# Patient Record
Sex: Male | Born: 2000 | Race: Black or African American | Hispanic: No | Marital: Single | Smoking: Never smoker
Health system: Southern US, Community
[De-identification: ages and names within clinical notes are randomized; demographics above are authoritative.]

## PROBLEM LIST (undated history)

## (undated) DIAGNOSIS — S62102A Fracture of unspecified carpal bone, left wrist, initial encounter for closed fracture: Secondary | ICD-10-CM

## (undated) DIAGNOSIS — A549 Gonococcal infection, unspecified: Secondary | ICD-10-CM

## (undated) DIAGNOSIS — F329 Major depressive disorder, single episode, unspecified: Secondary | ICD-10-CM

## (undated) DIAGNOSIS — F909 Attention-deficit hyperactivity disorder, unspecified type: Secondary | ICD-10-CM

## (undated) DIAGNOSIS — J309 Allergic rhinitis, unspecified: Secondary | ICD-10-CM

## (undated) DIAGNOSIS — J45909 Unspecified asthma, uncomplicated: Secondary | ICD-10-CM

## (undated) DIAGNOSIS — A749 Chlamydial infection, unspecified: Secondary | ICD-10-CM

## (undated) DIAGNOSIS — F32A Depression, unspecified: Secondary | ICD-10-CM

## (undated) DIAGNOSIS — Z8709 Personal history of other diseases of the respiratory system: Secondary | ICD-10-CM

## (undated) DIAGNOSIS — T7840XA Allergy, unspecified, initial encounter: Secondary | ICD-10-CM

## (undated) HISTORY — DX: Chlamydial infection, unspecified: A74.9

## (undated) HISTORY — DX: Fracture of unspecified carpal bone, left wrist, initial encounter for closed fracture: S62.102A

## (undated) HISTORY — DX: Personal history of other diseases of the respiratory system: Z87.09

## (undated) HISTORY — DX: Allergic rhinitis, unspecified: J30.9

## (undated) HISTORY — PX: TYMPANOSTOMY TUBE PLACEMENT: SHX32

---
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
## 2021-02-10 IMAGING — DX DG CHEST 1V PORT
1 series · 1 of 1 positions shown · non-contrast
Comparison: 11/17/2019

CLINICAL DATA: Follow-up pneumothorax.

EXAM:
PORTABLE CHEST 1 VIEW

[chest ap]
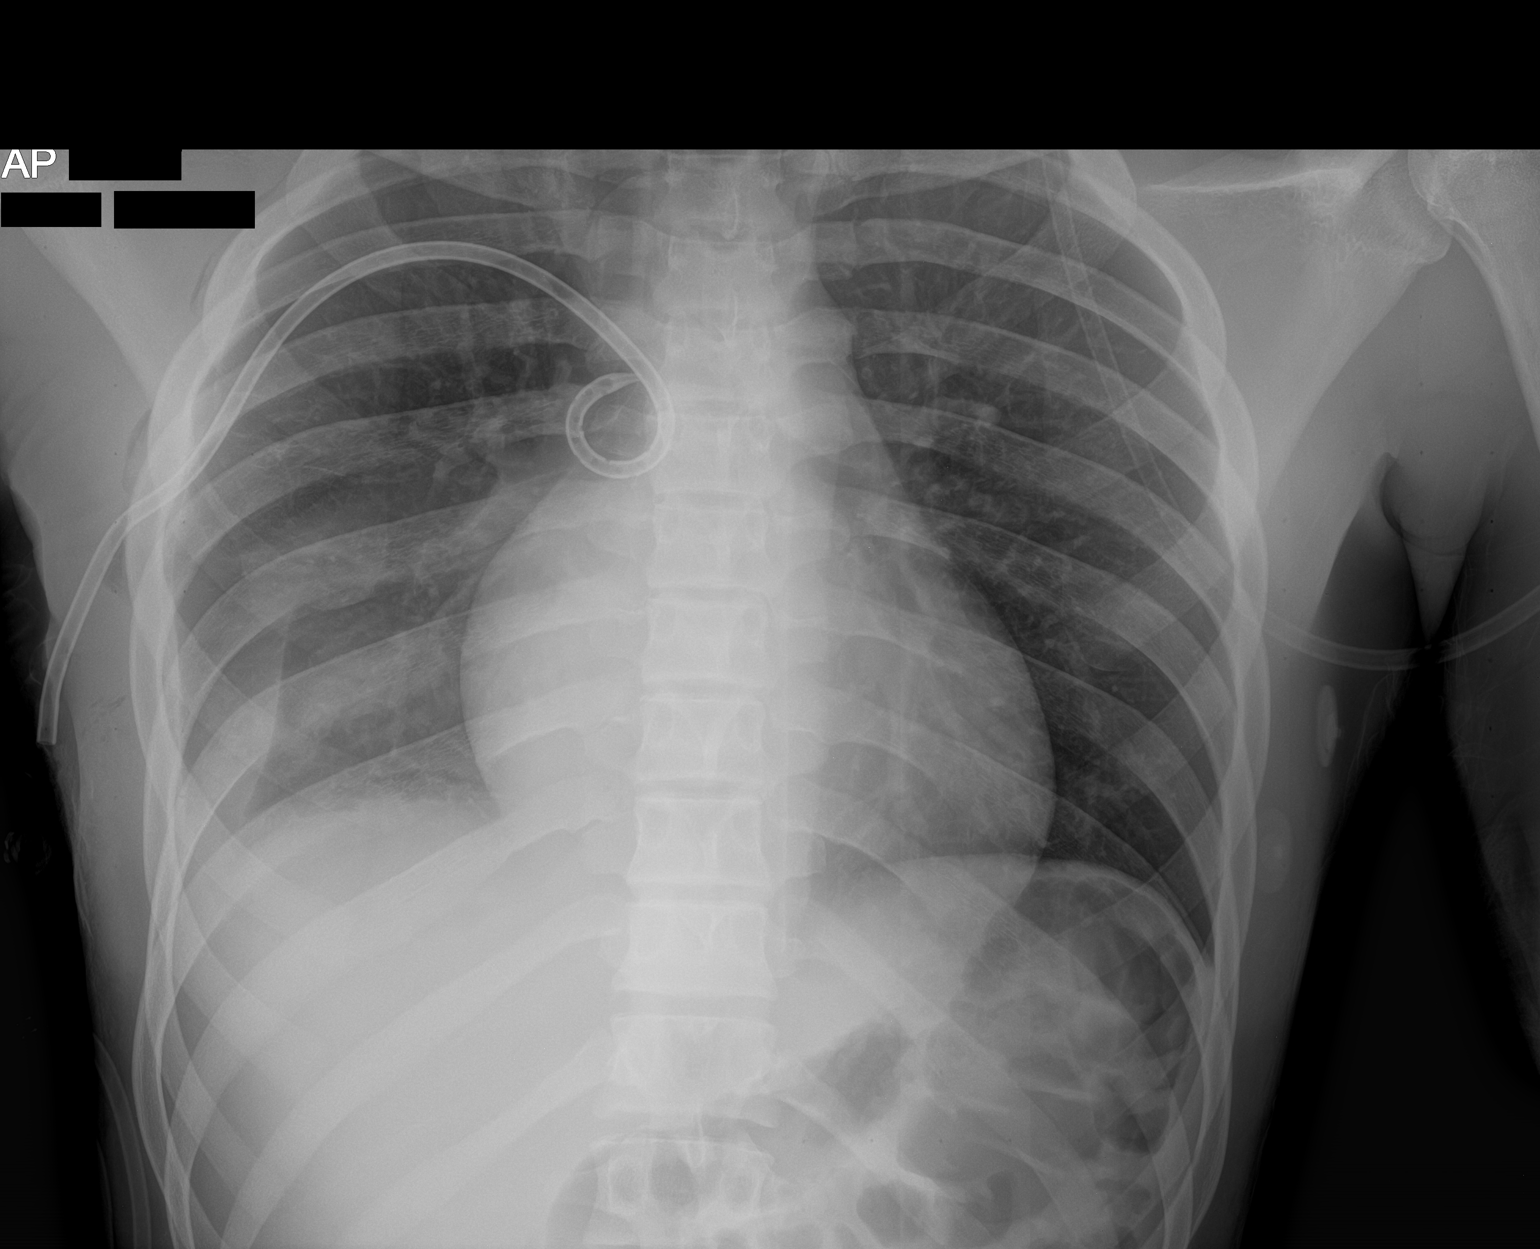

[1 of 1 positions shown; findings below may reference images not displayed]

FINDINGS: Right-sided chest tube in good position. Very small residual
pneumothorax. Small right pleural effusion and right basilar
atelectasis. The left lung is clear.

Small amount subcutaneous emphysema.
IMPRESSION: Right-sided chest tube in good position with a very small residual
pneumothorax.

## 2021-02-13 IMAGING — DX DG CHEST 1V PORT
1 series · 1 of 1 positions shown · non-contrast
Comparison: Yesterday

CLINICAL DATA: Chest tube

EXAM:
PORTABLE CHEST 1 VIEW

[chest ap]
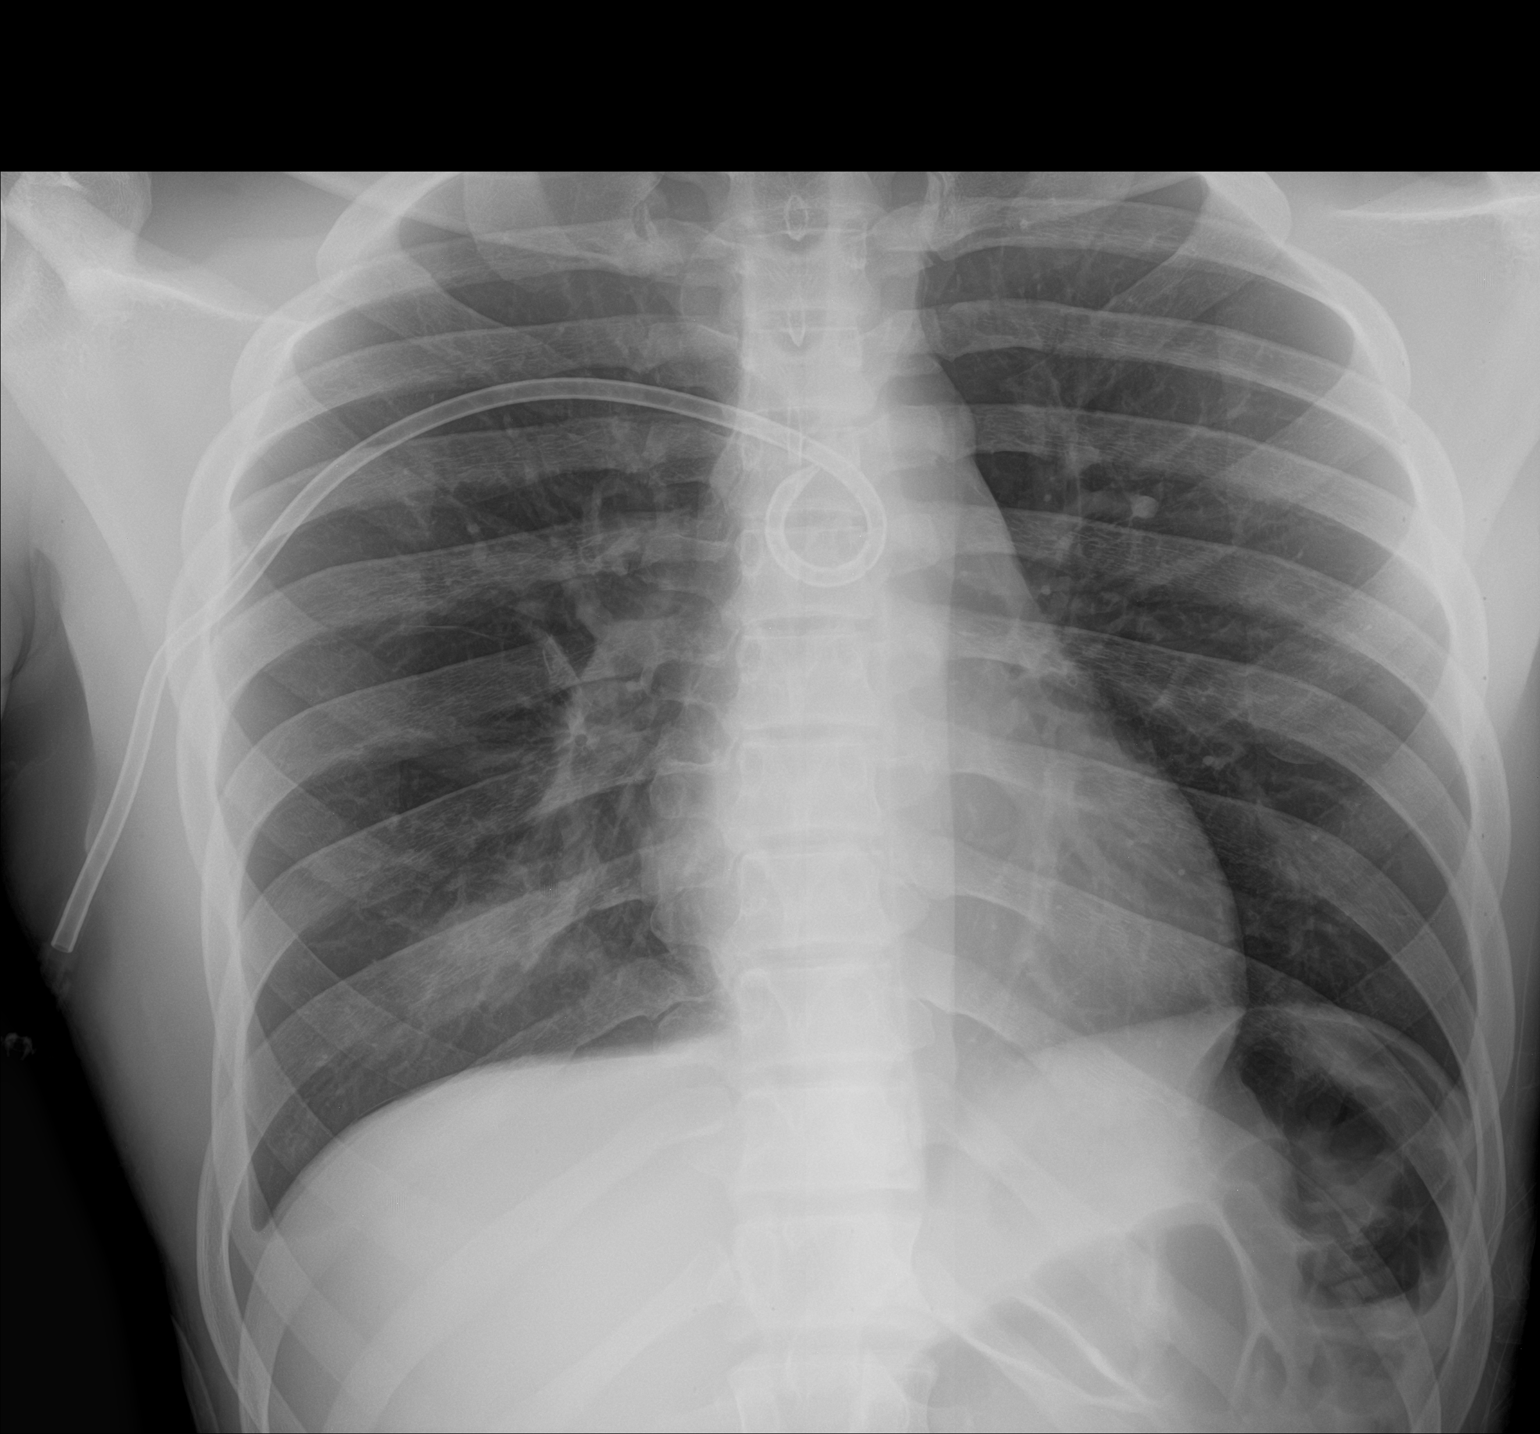

[1 of 1 positions shown; findings below may reference images not displayed]

FINDINGS: Unchanged chest tube with pigtail in the midline, presumably
anterior. Trace right apical pneumothorax. Mild hazy density at the
right base where there was pleural fluid and atelectasis on
admission chest CT.
IMPRESSION: Stable trace right apical pneumothorax.

## 2021-02-13 IMAGING — DX DG CHEST 1V PORT
1 series · 1 of 1 positions shown · non-contrast
Comparison: November 21, 2019

CLINICAL DATA: Achalasia.  Chest tube removal

EXAM:
PORTABLE CHEST 1 VIEW

[chest]
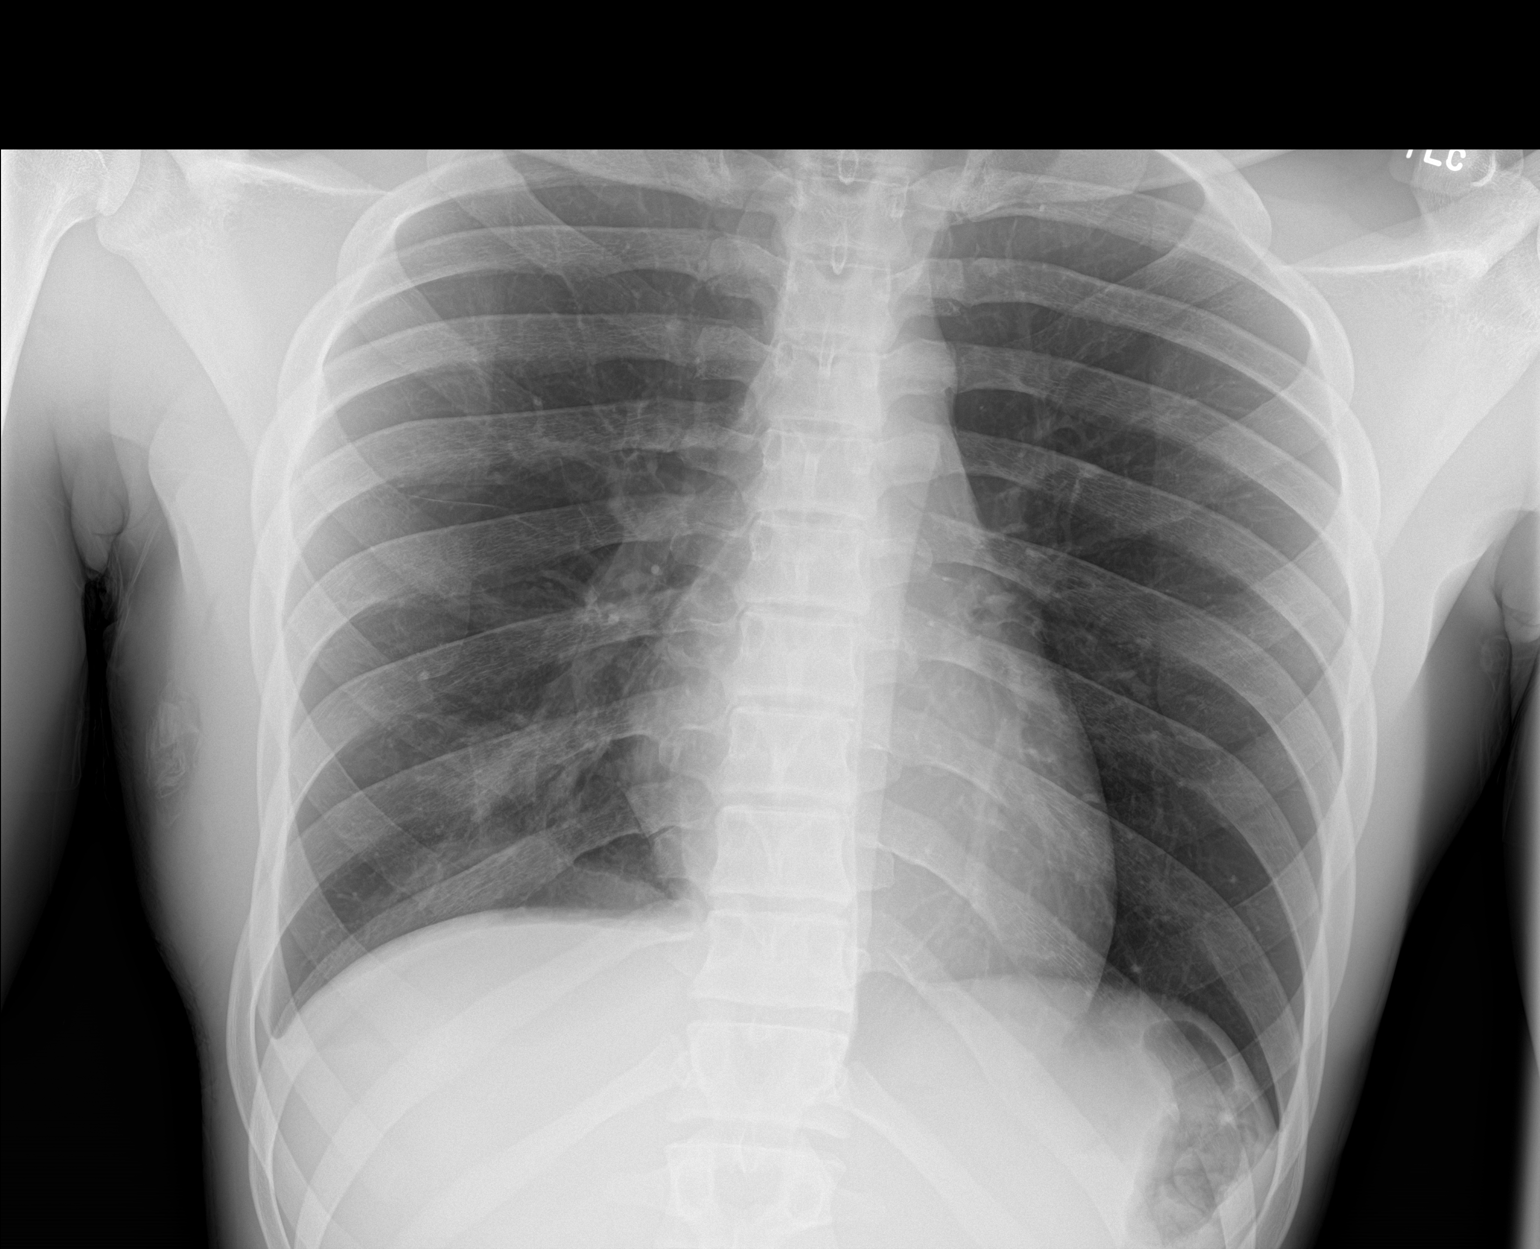

[1 of 1 positions shown; findings below may reference images not displayed]

FINDINGS: Chest tube on the right is been removed. Trace right apical
pneumothorax persists without change. Slight atelectasis remains in
the right base. No new opacity evident. Heart size and pulmonary
vascularity are normal. No esophageal dilatation evident. No
adenopathy. No bone lesions.
IMPRESSION: Trace pneumothorax on the right persists following chest tube
removal. No progression of pneumothorax. Atelectatic change right
base, similar to earlier in the day. No new opacity. Stable cardiac
silhouette.

## 2021-06-21 DIAGNOSIS — S63502A Unspecified sprain of left wrist, initial encounter: Secondary | ICD-10-CM | POA: Diagnosis not present

## 2021-06-21 DIAGNOSIS — M25532 Pain in left wrist: Secondary | ICD-10-CM | POA: Diagnosis not present

## 2021-08-05 ENCOUNTER — Encounter: Payer: Self-pay | Admitting: Emergency Medicine

## 2021-08-05 ENCOUNTER — Ambulatory Visit (INDEPENDENT_AMBULATORY_CARE_PROVIDER_SITE_OTHER): Payer: Medicaid Other

## 2021-08-05 ENCOUNTER — Ambulatory Visit
Admission: EM | Admit: 2021-08-05 | Discharge: 2021-08-05 | Disposition: A | Discharge status: Home / Self Care | Payer: Medicaid Other | Attending: Urgent Care | Admitting: Urgent Care

## 2021-08-05 DIAGNOSIS — M79641 Pain in right hand: Secondary | ICD-10-CM

## 2021-08-05 DIAGNOSIS — M7989 Other specified soft tissue disorders: Secondary | ICD-10-CM | POA: Diagnosis not present

## 2021-08-05 DIAGNOSIS — S60229A Contusion of unspecified hand, initial encounter: Secondary | ICD-10-CM

## 2021-08-05 MED ORDER — NAPROXEN 500 MG PO TABS: 500.0000 mg | ORAL_TABLET | Freq: Two times a day (BID) | ORAL | 0 refills | Status: DC

## 2021-08-05 NOTE — ED Triage Notes (Signed)
Pt was very difficult to arouse from sleep in the waiting room. Pt is at times incoherent during triage, but states he just went to be late, but was up this morning playing basketball around 8;30 this morning when he states he fell forward and caught himself with his right hand. Pt presents with right hand swelling and pain.
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
## 2022-02-08 ENCOUNTER — Ambulatory Visit
Admission: EM | Admit: 2022-02-08 | Discharge: 2022-02-08 | Disposition: A | Discharge status: Home / Self Care | Payer: Medicaid Other | Attending: Urgent Care | Admitting: Urgent Care

## 2022-02-08 ENCOUNTER — Ambulatory Visit (INDEPENDENT_AMBULATORY_CARE_PROVIDER_SITE_OTHER): Payer: Medicaid Other

## 2022-02-08 DIAGNOSIS — M25532 Pain in left wrist: Secondary | ICD-10-CM | POA: Diagnosis not present

## 2022-02-08 DIAGNOSIS — R52 Pain, unspecified: Secondary | ICD-10-CM

## 2022-02-08 DIAGNOSIS — S62102A Fracture of unspecified carpal bone, left wrist, initial encounter for closed fracture: Secondary | ICD-10-CM | POA: Diagnosis not present

## 2022-02-08 DIAGNOSIS — M545 Low back pain, unspecified: Secondary | ICD-10-CM | POA: Insufficient documentation

## 2022-02-08 DIAGNOSIS — M542 Cervicalgia: Secondary | ICD-10-CM | POA: Diagnosis not present

## 2022-02-08 DIAGNOSIS — Z113 Encounter for screening for infections with a predominantly sexual mode of transmission: Secondary | ICD-10-CM

## 2022-02-08 MED ORDER — TIZANIDINE HCL 4 MG PO TABS: 4.0000 mg | ORAL_TABLET | Freq: Every day | ORAL | 0 refills | Status: DC

## 2022-02-08 MED ORDER — NAPROXEN 500 MG PO TABS: 500.0000 mg | ORAL_TABLET | Freq: Two times a day (BID) | ORAL | 0 refills | Status: DC

## 2022-02-08 NOTE — Discharge Instructions (Signed)
The x-ray suggest that you have a scaphoid fracture.  It is not definitive.  I recommend you wear the thumb spica splint at all times.  Once you to follow-up with [facility].  The specialist that you see there may end up getting more x-rays or getting a CT scan which is much more specific.  In the meantime continue using naproxen for pain and inflammation.
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
## 2022-02-09 LAB — CYTOLOGY, (ORAL, ANAL, URETHRAL) ANCILLARY ONLY
Chlamydia: NEGATIVE
Comment: NEGATIVE
Comment: NEGATIVE
Comment: NORMAL
Neisseria Gonorrhea: NEGATIVE
Trichomonas: NEGATIVE

## 2022-03-03 DIAGNOSIS — M545 Low back pain, unspecified: Secondary | ICD-10-CM | POA: Diagnosis not present

## 2022-03-11 ENCOUNTER — Encounter: Payer: Self-pay | Admitting: Family Medicine

## 2022-03-11 ENCOUNTER — Ambulatory Visit (INDEPENDENT_AMBULATORY_CARE_PROVIDER_SITE_OTHER): Payer: Self-pay | Admitting: Family Medicine

## 2022-03-11 VITALS — BP 113/71 | HR 53 | Temp 98.5°F | Ht 73.0 in | Wt 167.6 lb

## 2022-03-11 DIAGNOSIS — M545 Low back pain, unspecified: Secondary | ICD-10-CM

## 2022-03-11 DIAGNOSIS — S62002G Unspecified fracture of navicular [scaphoid] bone of left wrist, subsequent encounter for fracture with delayed healing: Secondary | ICD-10-CM

## 2022-03-11 DIAGNOSIS — M25532 Pain in left wrist: Secondary | ICD-10-CM

## 2022-03-11 MED ORDER — MELOXICAM 15 MG PO TABS: 15.0000 mg | ORAL_TABLET | Freq: Every day | ORAL | 0 refills | Status: AC
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
## 2022-03-15 ENCOUNTER — Ambulatory Visit (HOSPITAL_BASED_OUTPATIENT_CLINIC_OR_DEPARTMENT_OTHER)
Admission: RE | Admit: 2022-03-15 | Discharge: 2022-03-15 | Disposition: A | Discharge status: Home / Self Care | Payer: Medicaid Other | Source: Ambulatory Visit | Attending: Family Medicine | Admitting: Family Medicine

## 2022-03-15 DIAGNOSIS — S62002G Unspecified fracture of navicular [scaphoid] bone of left wrist, subsequent encounter for fracture with delayed healing: Secondary | ICD-10-CM | POA: Diagnosis present

## 2022-03-15 DIAGNOSIS — M545 Low back pain, unspecified: Secondary | ICD-10-CM | POA: Diagnosis not present

## 2022-03-15 DIAGNOSIS — M4316 Spondylolisthesis, lumbar region: Secondary | ICD-10-CM | POA: Diagnosis not present

## 2022-03-15 DIAGNOSIS — S62112A Displaced fracture of triquetrum [cuneiform] bone, left wrist, initial encounter for closed fracture: Secondary | ICD-10-CM | POA: Diagnosis not present

## 2022-03-15 DIAGNOSIS — M25532 Pain in left wrist: Secondary | ICD-10-CM

## 2022-03-16 DIAGNOSIS — S62002A Unspecified fracture of navicular [scaphoid] bone of left wrist, initial encounter for closed fracture: Secondary | ICD-10-CM | POA: Diagnosis not present

## 2022-03-16 DIAGNOSIS — M545 Low back pain, unspecified: Secondary | ICD-10-CM | POA: Diagnosis not present

## 2022-03-17 ENCOUNTER — Other Ambulatory Visit: Payer: Self-pay | Admitting: Orthopedic Surgery

## 2022-03-17 DIAGNOSIS — M545 Low back pain, unspecified: Secondary | ICD-10-CM | POA: Diagnosis not present

## 2022-03-17 DIAGNOSIS — S62002A Unspecified fracture of navicular [scaphoid] bone of left wrist, initial encounter for closed fracture: Secondary | ICD-10-CM

## 2022-03-22 DIAGNOSIS — M545 Low back pain, unspecified: Secondary | ICD-10-CM | POA: Diagnosis not present

## 2022-03-24 ENCOUNTER — Ambulatory Visit: Payer: Medicaid Other | Attending: Orthopedic Surgery

## 2022-03-30 DIAGNOSIS — M545 Low back pain, unspecified: Secondary | ICD-10-CM | POA: Diagnosis not present

## 2022-04-01 ENCOUNTER — Emergency Department (HOSPITAL_COMMUNITY)
Admission: EM | Admit: 2022-04-01 | Discharge: 2022-04-02 | Disposition: A | Discharge status: Home / Self Care | Payer: No Typology Code available for payment source | Attending: Emergency Medicine | Admitting: Emergency Medicine

## 2022-04-01 ENCOUNTER — Emergency Department (HOSPITAL_COMMUNITY): Payer: No Typology Code available for payment source

## 2022-04-01 ENCOUNTER — Other Ambulatory Visit: Payer: Self-pay

## 2022-04-01 ENCOUNTER — Ambulatory Visit: Payer: Medicaid Other | Admitting: Family Medicine

## 2022-04-01 ENCOUNTER — Encounter (HOSPITAL_COMMUNITY): Payer: Self-pay | Admitting: Emergency Medicine

## 2022-04-01 DIAGNOSIS — G8911 Acute pain due to trauma: Secondary | ICD-10-CM | POA: Diagnosis not present

## 2022-04-01 DIAGNOSIS — J45909 Unspecified asthma, uncomplicated: Secondary | ICD-10-CM | POA: Diagnosis not present

## 2022-04-01 DIAGNOSIS — Y92410 Unspecified street and highway as the place of occurrence of the external cause: Secondary | ICD-10-CM | POA: Insufficient documentation

## 2022-04-01 DIAGNOSIS — M25511 Pain in right shoulder: Secondary | ICD-10-CM

## 2022-04-01 DIAGNOSIS — M25572 Pain in left ankle and joints of left foot: Secondary | ICD-10-CM

## 2022-04-01 DIAGNOSIS — Z7951 Long term (current) use of inhaled steroids: Secondary | ICD-10-CM | POA: Diagnosis not present

## 2022-04-01 DIAGNOSIS — M545 Low back pain, unspecified: Secondary | ICD-10-CM | POA: Diagnosis not present

## 2022-04-01 MED ORDER — KETOROLAC TROMETHAMINE 60 MG/2ML IM SOLN
30.0000 mg | Freq: Once | INTRAMUSCULAR | Status: AC
  Administered 2022-04-02: 30 mg via INTRAMUSCULAR
  Filled 2022-04-01: qty 2
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
## 2022-04-02 DIAGNOSIS — M25572 Pain in left ankle and joints of left foot: Secondary | ICD-10-CM | POA: Diagnosis not present

## 2022-04-02 DIAGNOSIS — M25511 Pain in right shoulder: Secondary | ICD-10-CM | POA: Diagnosis not present

## 2022-04-02 NOTE — ED Notes (Signed)
Sling applied to patient. Patient given discharge instructions and follow up care. Patient verbalized understanding. Patient ambulatory out of ED.

## 2022-04-06 ENCOUNTER — Ambulatory Visit
Admission: RE | Admit: 2022-04-06 | Discharge: 2022-04-06 | Disposition: A | Discharge status: Home / Self Care | Payer: Medicaid Other | Source: Ambulatory Visit | Attending: Orthopedic Surgery | Admitting: Orthopedic Surgery

## 2022-04-06 ENCOUNTER — Encounter: Payer: Self-pay | Admitting: Family Medicine

## 2022-04-06 ENCOUNTER — Ambulatory Visit (INDEPENDENT_AMBULATORY_CARE_PROVIDER_SITE_OTHER): Payer: Medicaid Other | Admitting: Family Medicine

## 2022-04-06 ENCOUNTER — Telehealth: Payer: Self-pay

## 2022-04-06 ENCOUNTER — Ambulatory Visit: Payer: Medicaid Other

## 2022-04-06 VITALS — BP 121/84 | HR 72 | Temp 98.7°F | Ht 74.0 in | Wt 162.8 lb

## 2022-04-06 DIAGNOSIS — S43402D Unspecified sprain of left shoulder joint, subsequent encounter: Secondary | ICD-10-CM

## 2022-04-06 DIAGNOSIS — M25511 Pain in right shoulder: Secondary | ICD-10-CM

## 2022-04-06 DIAGNOSIS — M545 Low back pain, unspecified: Secondary | ICD-10-CM

## 2022-04-06 DIAGNOSIS — M5136 Other intervertebral disc degeneration, lumbar region: Secondary | ICD-10-CM

## 2022-04-06 DIAGNOSIS — S62002A Unspecified fracture of navicular [scaphoid] bone of left wrist, initial encounter for closed fracture: Secondary | ICD-10-CM | POA: Insufficient documentation

## 2022-04-06 DIAGNOSIS — M51369 Other intervertebral disc degeneration, lumbar region without mention of lumbar back pain or lower extremity pain: Secondary | ICD-10-CM

## 2022-04-06 DIAGNOSIS — S43401A Unspecified sprain of right shoulder joint, initial encounter: Secondary | ICD-10-CM

## 2022-04-06 NOTE — Telephone Encounter (Signed)
Patient forgot to ask for PT referral at his appt today with Dr. Anitra Lauth.  Place he would like to be referred to: [facility] in [place] office  [place] can be reached at [phone]

## 2022-04-06 NOTE — Telephone Encounter (Signed)
Garrett Zhang, PT referral ordered

## 2022-04-06 NOTE — Telephone Encounter (Signed)
Their office number is [phone]. LVM for pt to return call.  Please Advise if pt needs new referral for PT, referral placed on 03/11/22.

## 2022-04-06 NOTE — Telephone Encounter (Signed)
If pt returns call, please inform of message below.
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
## 2022-04-07 NOTE — Telephone Encounter (Signed)
MyChart message has been sent to patient informing PT referral was ordered.

## 2022-04-08 ENCOUNTER — Ambulatory Visit: Payer: Self-pay | Admitting: Family Medicine

## 2022-04-11 ENCOUNTER — Ambulatory Visit: Payer: Medicaid Other | Admitting: Family Medicine

## 2022-04-13 DIAGNOSIS — M545 Low back pain, unspecified: Secondary | ICD-10-CM | POA: Diagnosis not present

## 2022-04-14 DIAGNOSIS — S62002A Unspecified fracture of navicular [scaphoid] bone of left wrist, initial encounter for closed fracture: Secondary | ICD-10-CM | POA: Diagnosis not present

## 2022-04-18 ENCOUNTER — Encounter: Payer: Self-pay | Admitting: Family Medicine

## 2022-04-19 DIAGNOSIS — M545 Low back pain, unspecified: Secondary | ICD-10-CM | POA: Diagnosis not present

## 2022-04-19 DIAGNOSIS — S62002A Unspecified fracture of navicular [scaphoid] bone of left wrist, initial encounter for closed fracture: Secondary | ICD-10-CM | POA: Diagnosis not present

## 2022-04-22 ENCOUNTER — Other Ambulatory Visit: Payer: Self-pay | Admitting: Family Medicine

## 2022-04-22 DIAGNOSIS — R079 Chest pain, unspecified: Secondary | ICD-10-CM | POA: Diagnosis not present

## 2022-04-22 DIAGNOSIS — R9431 Abnormal electrocardiogram [ECG] [EKG]: Secondary | ICD-10-CM | POA: Diagnosis not present

## 2022-04-22 DIAGNOSIS — J4530 Mild persistent asthma, uncomplicated: Secondary | ICD-10-CM

## 2022-04-22 NOTE — Telephone Encounter (Signed)
Pt's mother Garrett Zhang called and reports that her son needs a refill on his Albuterol and also his rescue inhaler which she did not know the name of. She mentioned "Qvar" (Sp). Garrett Zhang is also requesting pain medication due to his hand still hurting pretty bad. He was scheduled to get surgery today, but had to reschedule due to EKG results.

## 2022-04-22 NOTE — Telephone Encounter (Signed)
Pharmacy updated for refills.

## 2022-04-22 NOTE — Telephone Encounter (Signed)
Pt was last seen 3/27, 2 inhalers pending. Please also advise on pain med request.

## 2022-04-22 NOTE — Telephone Encounter (Signed)
I forgot to mention the Pharmacy to send this to is [facility] on [address] in [place] and his mother can be reached at [phone]

## 2022-04-25 MED ORDER — FLUTICASONE PROPIONATE HFA 110 MCG/ACT IN AERO: 1.0000 | INHALATION_SPRAY | Freq: Two times a day (BID) | RESPIRATORY_TRACT | 2 refills | Status: DC

## 2022-04-25 MED ORDER — ALBUTEROL SULFATE HFA 108 (90 BASE) MCG/ACT IN AERS: 2.0000 | INHALATION_SPRAY | RESPIRATORY_TRACT | 0 refills | Status: DC | PRN

## 2022-04-25 NOTE — Telephone Encounter (Signed)
Okay, refills sent 

## 2022-05-05 DIAGNOSIS — S62002A Unspecified fracture of navicular [scaphoid] bone of left wrist, initial encounter for closed fracture: Secondary | ICD-10-CM | POA: Diagnosis not present

## 2022-05-19 DIAGNOSIS — Z01818 Encounter for other preprocedural examination: Secondary | ICD-10-CM | POA: Diagnosis not present

## 2022-05-26 ENCOUNTER — Ambulatory Visit
Admission: EM | Admit: 2022-05-26 | Discharge: 2022-05-26 | Disposition: A | Discharge status: Home / Self Care | Payer: Medicaid Other | Attending: Urgent Care | Admitting: Urgent Care

## 2022-05-26 DIAGNOSIS — Z202 Contact with and (suspected) exposure to infections with a predominantly sexual mode of transmission: Secondary | ICD-10-CM | POA: Insufficient documentation

## 2022-05-26 MED ORDER — DOXYCYCLINE HYCLATE 100 MG PO CAPS: 100.0000 mg | ORAL_CAPSULE | Freq: Two times a day (BID) | ORAL | 0 refills | Status: AC
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
## 2022-05-26 NOTE — Discharge Instructions (Addendum)
We are treating you today for a presumed chlamydia infection given your recent positive exposure.  We are also testing you for both chlamydia and gonorrhea.  If your test comes back positive for an infection other than chlamydia, someone will contact you to arrange treatment.  Follow up here or with your primary care provider if your symptoms are worsening or not improving with treatment.

## 2022-05-26 NOTE — ED Triage Notes (Signed)
Patient presents to [facility] for STD testing. Exposed to chlamydia. No symptoms.

## 2022-05-27 LAB — CYTOLOGY, (ORAL, ANAL, URETHRAL) ANCILLARY ONLY
Chlamydia: NEGATIVE
Comment: NEGATIVE
Comment: NEGATIVE
Comment: NORMAL
Neisseria Gonorrhea: POSITIVE — AB
Trichomonas: NEGATIVE

## 2022-05-30 ENCOUNTER — Emergency Department (HOSPITAL_COMMUNITY)
Admission: EM | Admit: 2022-05-30 | Discharge: 2022-05-30 | Disposition: A | Discharge status: Home / Self Care | Payer: Medicaid Other | Attending: Emergency Medicine | Admitting: Emergency Medicine

## 2022-05-30 ENCOUNTER — Other Ambulatory Visit: Payer: Self-pay

## 2022-05-30 ENCOUNTER — Encounter (HOSPITAL_COMMUNITY): Payer: Self-pay

## 2022-05-30 DIAGNOSIS — A549 Gonococcal infection, unspecified: Secondary | ICD-10-CM | POA: Diagnosis not present

## 2022-05-30 HISTORY — DX: Gonococcal infection, unspecified: A54.9

## 2022-05-30 MED ORDER — CEFTRIAXONE SODIUM 1 G IJ SOLR
500.0000 mg | Freq: Once | INTRAMUSCULAR | Status: AC
  Administered 2022-05-30: 500 mg via INTRAMUSCULAR
  Filled 2022-05-30: qty 10

## 2022-05-30 MED ORDER — LIDOCAINE HCL (PF) 1 % IJ SOLN
INTRAMUSCULAR | Status: AC
  Filled 2022-05-30: qty 30

## 2022-05-30 NOTE — ED Provider Notes (Signed)
[facility] EMERGENCY DEPARTMENT AT [facility] Provider Note   CSN: [id number] Arrival date & time: 05/30/22  0230     History  Chief Complaint  Patient presents with   SEXUALLY TRANSMITTED DISEASE    Garrett Zhang is a 22 y.o. male.  22 year old male presents for treatment of gonorrhea, seen at UC 4 days ago after chlamydia exposure and treated with Doxycycline which he is taking. No symptoms. Tested positive for gonorrhea, negative for chlamydia.        Home Medications Prior to Admission medications   Medication Sig Start Date End Date Taking? Authorizing Provider  albuterol (VENTOLIN HFA) 108 (90 Base) MCG/ACT inhaler Inhale 2 puffs into the lungs every 4 (four) hours as needed for wheezing. Patient may resume home supply. 04/25/22   McGowen, Maryjean Morn, MD  doxycycline (VIBRAMYCIN) 100 MG capsule Take 1 capsule (100 mg total) by mouth 2 (two) times daily for 7 days. Avoid direct exposure to sun. 05/26/22 06/02/22  Immordino, Jeannett Senior, FNP  FLUoxetine (PROZAC) 20 MG capsule Take by mouth. 03/01/19   [provider]  fluticasone (FLOVENT HFA) 110 MCG/ACT inhaler Inhale 1 puff into the lungs 2 (two) times daily. 04/25/22   McGowen, Maryjean Morn, MD  meloxicam (MOBIC) 15 MG tablet Take 1 tablet (15 mg total) by mouth daily. 03/11/22   McGowen, Maryjean Morn, MD      Allergies    Bee pollen, Chocolate flavor, Pollen extract, Cocoa, Tramadol hcl, Chocolate, Hydrocodone, Other, Tramadol, and Other    Review of Systems   Review of Systems Negative except as per HPI Physical Exam Updated Vital Signs BP (!) 154/114 (BP Location: Right Arm)   Pulse 63   Temp 98.1 F (36.7 C) (Oral)   Resp 18   Ht 6\' 2"  (1.88 m)   Wt 73.5 kg   SpO2 95%   BMI 20.80 kg/m  Physical Exam Vitals and nursing note reviewed.  Constitutional:      General: He is not in acute distress.    Appearance: He is well-developed. He is not diaphoretic.  HENT:     Head: Normocephalic and  atraumatic.  Cardiovascular:     Rate and Rhythm: Normal rate and regular rhythm.     Heart sounds: Normal heart sounds.  Pulmonary:     Effort: Pulmonary effort is normal.     Breath sounds: Normal breath sounds.  Skin:    General: Skin is warm and dry.  Neurological:     Mental Status: He is alert and oriented to person, place, and time.  Psychiatric:        Behavior: Behavior normal.     ED Results / Procedures / Treatments   Labs (all labs ordered are listed, but only abnormal results are displayed) Labs Reviewed - No data to display  EKG None  Radiology No results found.  Procedures Procedures    Medications Ordered in ED Medications  cefTRIAXone (ROCEPHIN) injection 500 mg (has no administration in time range)  lidocaine (PF) (XYLOCAINE) 1 % injection (has no administration in time range)    ED Course/ Medical Decision Making/ A&P                             Medical Decision Making Risk Prescription drug management.   22 year old male presents for treatment of his gonorrhea, he is asymptomatic.  Currently taking doxycycline as prescribed at urgent care 4 days ago where he  was tested and treated for presumed chlamydia exposure.  He is tested positive for gonorrhea, will treat with Rocephin and recommend follow-up with health department.        Final Clinical Impression(s) / ED Diagnoses Final diagnoses:  Gonorrhea    Rx / DC Orders ED Discharge Orders     None         Alden Hipp 05/30/22 Tommy Rainwater, MD 05/30/22 [phone]

## 2022-05-30 NOTE — ED Triage Notes (Signed)
Pt. Arrives POV stating that his STD test came back positive for gonorrhea. Pt. States that he has been on antibiotics for 4 days, but his penis is still leaking.

## 2022-05-30 NOTE — Discharge Instructions (Signed)
Complete your doxycycline as prescribed at urgent care. Do NOT have sex until all parties have completed their antibiotics. Follow up at the health department for FREE STD TESTING AND TREATMENT.

## 2022-05-31 DIAGNOSIS — S62002D Unspecified fracture of navicular [scaphoid] bone of left wrist, subsequent encounter for fracture with routine healing: Secondary | ICD-10-CM | POA: Diagnosis not present

## 2022-06-07 DIAGNOSIS — S62002A Unspecified fracture of navicular [scaphoid] bone of left wrist, initial encounter for closed fracture: Secondary | ICD-10-CM | POA: Diagnosis not present

## 2022-06-15 DIAGNOSIS — R369 Urethral discharge, unspecified: Secondary | ICD-10-CM | POA: Diagnosis not present

## 2022-06-15 DIAGNOSIS — Z113 Encounter for screening for infections with a predominantly sexual mode of transmission: Secondary | ICD-10-CM | POA: Diagnosis not present

## 2022-07-01 ENCOUNTER — Ambulatory Visit
Admission: EM | Admit: 2022-07-01 | Discharge: 2022-07-01 | Disposition: A | Discharge status: Home / Self Care | Payer: Medicaid Other | Attending: Urgent Care | Admitting: Urgent Care

## 2022-07-01 DIAGNOSIS — J4530 Mild persistent asthma, uncomplicated: Secondary | ICD-10-CM | POA: Insufficient documentation

## 2022-07-01 DIAGNOSIS — Z1152 Encounter for screening for COVID-19: Secondary | ICD-10-CM | POA: Insufficient documentation

## 2022-07-01 DIAGNOSIS — J988 Other specified respiratory disorders: Secondary | ICD-10-CM | POA: Insufficient documentation

## 2022-07-01 DIAGNOSIS — R070 Pain in throat: Secondary | ICD-10-CM | POA: Insufficient documentation

## 2022-07-01 DIAGNOSIS — B9789 Other viral agents as the cause of diseases classified elsewhere: Secondary | ICD-10-CM | POA: Diagnosis not present

## 2022-07-01 LAB — POCT RAPID STREP A (OFFICE): Rapid Strep A Screen: NEGATIVE

## 2022-07-01 MED ORDER — PROMETHAZINE-DM 6.25-15 MG/5ML PO SYRP: 5.0000 mL | ORAL_SOLUTION | Freq: Three times a day (TID) | ORAL | 0 refills | Status: AC | PRN

## 2022-07-01 MED ORDER — IBUPROFEN 800 MG PO TABS
800.0000 mg | ORAL_TABLET | Freq: Once | ORAL | Status: AC
  Administered 2022-07-01: 800 mg via ORAL

## 2022-07-01 MED ORDER — CETIRIZINE HCL 10 MG PO TABS: 10.0000 mg | ORAL_TABLET | Freq: Every day | ORAL | 0 refills | Status: DC

## 2022-07-01 MED ORDER — PSEUDOEPHEDRINE HCL 30 MG PO TABS: 30.0000 mg | ORAL_TABLET | Freq: Three times a day (TID) | ORAL | 0 refills | Status: AC | PRN

## 2022-07-01 MED ORDER — ALBUTEROL SULFATE HFA 108 (90 BASE) MCG/ACT IN AERS: 2.0000 | INHALATION_SPRAY | RESPIRATORY_TRACT | 0 refills | Status: DC | PRN

## 2022-07-01 MED ORDER — IBUPROFEN 600 MG PO TABS: 600.0000 mg | ORAL_TABLET | Freq: Four times a day (QID) | ORAL | 0 refills | Status: AC | PRN

## 2022-07-01 NOTE — Discharge Instructions (Signed)
We will notify you of your test results as they arrive and may take between about 24 hours.  I encourage you to sign up for MyChart if you have not already done so as this can be the easiest way for us to communicate results to you online or through a phone app.  Generally, we only contact you if it is a positive test result.  In the meantime, if you develop worsening symptoms including fever, chest pain, shortness of breath despite our current treatment plan then please report to the emergency room as this may be a sign of worsening status from possible viral infection.  Otherwise, we will manage this as a viral syndrome. For sore throat or cough try using a honey-based tea. Use 3 teaspoons of honey with juice squeezed from half lemon. Place shaved pieces of ginger into 1/2-1 cup of water and warm over stove top. Then mix the ingredients and repeat every 4 hours as needed. Please take Tylenol 500mg-650mg every 6 hours for aches and pains, fevers. Hydrate very well with at least 2 liters of water. Eat light meals such as soups to replenish electrolytes and soft fruits, veggies. Start an antihistamine like Zyrtec (10mg daily) for postnasal drainage, sinus congestion.  You can take this together with pseudoephedrine (Sudafed) at a dose of 30 mg 2-3 times a day as needed for the same kind of congestion.  Use the cough medications as needed.  
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
## 2022-07-01 NOTE — ED Triage Notes (Signed)
Pt reports sore throat, chills, headache and vomiting x 2 days. Pt had Tylenol 1 hr ago.   Pt requested albuterol inhaler.

## 2022-07-02 LAB — SARS CORONAVIRUS 2 (TAT 6-24 HRS): SARS Coronavirus 2: NEGATIVE

## 2022-07-03 LAB — CULTURE, GROUP A STREP (THRC)

## 2022-07-19 DIAGNOSIS — S62002A Unspecified fracture of navicular [scaphoid] bone of left wrist, initial encounter for closed fracture: Secondary | ICD-10-CM | POA: Diagnosis not present

## 2022-08-12 ENCOUNTER — Telehealth: Payer: Self-pay | Admitting: Family Medicine

## 2022-08-12 NOTE — Telephone Encounter (Signed)
Please resend law information to [phone] or [phone].   Thank you

## 2022-08-12 NOTE — Telephone Encounter (Signed)
Original record request from law office re-faxed to medical records with note to send to fax numbers provided below.

## 2022-08-12 NOTE — Telephone Encounter (Signed)
The law office called to follow up on medical releas

## 2022-08-25 ENCOUNTER — Ambulatory Visit: Payer: Medicaid Other | Admitting: Family Medicine
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
## 2022-08-26 ENCOUNTER — Ambulatory Visit: Payer: MEDICAID | Admitting: Family Medicine
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
## 2022-08-29 ENCOUNTER — Ambulatory Visit: Payer: MEDICAID | Admitting: Family Medicine

## 2022-08-29 ENCOUNTER — Encounter: Payer: Self-pay | Admitting: Family Medicine

## 2022-08-29 VITALS — BP 139/87 | HR 66 | Wt 158.8 lb

## 2022-08-29 DIAGNOSIS — J180 Bronchopneumonia, unspecified organism: Secondary | ICD-10-CM | POA: Diagnosis not present

## 2022-08-29 DIAGNOSIS — J4531 Mild persistent asthma with (acute) exacerbation: Secondary | ICD-10-CM | POA: Diagnosis not present

## 2022-08-29 DIAGNOSIS — J4530 Mild persistent asthma, uncomplicated: Secondary | ICD-10-CM

## 2022-08-29 MED ORDER — METHYLPREDNISOLONE ACETATE 80 MG/ML IJ SUSP
80.0000 mg | Freq: Once | INTRAMUSCULAR | Status: AC
  Administered 2022-08-29: 80 mg via INTRAMUSCULAR

## 2022-08-29 MED ORDER — PREDNISONE 20 MG PO TABS: ORAL_TABLET | ORAL | 0 refills | Status: DC

## 2022-08-29 MED ORDER — FLUTICASONE PROPIONATE HFA 110 MCG/ACT IN AERO: 1.0000 | INHALATION_SPRAY | Freq: Two times a day (BID) | RESPIRATORY_TRACT | 5 refills | Status: AC

## 2022-08-29 MED ORDER — ALBUTEROL SULFATE HFA 108 (90 BASE) MCG/ACT IN AERS: 2.0000 | INHALATION_SPRAY | RESPIRATORY_TRACT | 0 refills | Status: AC | PRN

## 2022-08-29 MED ORDER — AMOXICILLIN 875 MG PO TABS: 875.0000 mg | ORAL_TABLET | Freq: Two times a day (BID) | ORAL | 0 refills | Status: AC
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
## 2022-08-29 NOTE — Addendum Note (Signed)
Addended by: Arty Baumgartner A on: 08/29/2022 09:51 AM   Modules accepted: Orders

## 2022-08-29 NOTE — Addendum Note (Signed)
Addended by: Jeoffrey Massed on: 08/29/2022 09:43 AM   Modules accepted: Orders

## 2022-09-05 NOTE — Patient Instructions (Signed)

## 2022-09-06 ENCOUNTER — Encounter: Payer: Self-pay | Admitting: Family Medicine

## 2022-09-06 ENCOUNTER — Other Ambulatory Visit (HOSPITAL_COMMUNITY)
Admission: RE | Admit: 2022-09-06 | Discharge: 2022-09-06 | Disposition: A | Discharge status: Home / Self Care | Payer: MEDICAID | Source: Ambulatory Visit | Attending: Family Medicine | Admitting: Family Medicine

## 2022-09-06 ENCOUNTER — Ambulatory Visit (INDEPENDENT_AMBULATORY_CARE_PROVIDER_SITE_OTHER): Payer: MEDICAID | Admitting: Family Medicine

## 2022-09-06 VITALS — BP 134/85 | HR 65 | Ht 74.0 in | Wt 161.2 lb

## 2022-09-06 DIAGNOSIS — Z0000 Encounter for general adult medical examination without abnormal findings: Secondary | ICD-10-CM | POA: Diagnosis not present

## 2022-09-06 DIAGNOSIS — Z113 Encounter for screening for infections with a predominantly sexual mode of transmission: Secondary | ICD-10-CM | POA: Insufficient documentation

## 2022-09-06 DIAGNOSIS — Z1159 Encounter for screening for other viral diseases: Secondary | ICD-10-CM | POA: Diagnosis not present

## 2022-09-06 DIAGNOSIS — Z23 Encounter for immunization: Secondary | ICD-10-CM

## 2022-09-06 DIAGNOSIS — J4541 Moderate persistent asthma with (acute) exacerbation: Secondary | ICD-10-CM

## 2022-09-06 DIAGNOSIS — Z8619 Personal history of other infectious and parasitic diseases: Secondary | ICD-10-CM

## 2022-09-06 LAB — COMPREHENSIVE METABOLIC PANEL
ALT: 35 U/L (ref 0–53)
AST: 29 U/L (ref 0–37)
Albumin: 4.7 g/dL (ref 3.5–5.2)
Alkaline Phosphatase: 55 U/L (ref 39–117)
BUN: 18 mg/dL (ref 6–23)
CO2: 31 mEq/L (ref 19–32)
Calcium: 10.4 mg/dL (ref 8.4–10.5)
Chloride: 99 mEq/L (ref 96–112)
Creatinine, Ser: 1.16 mg/dL (ref 0.40–1.50)
GFR: 90.39 mL/min (ref >60.00)
Glucose, Bld: 88 mg/dL (ref 70–99)
Potassium: 4.8 mEq/L (ref 3.5–5.1)
Sodium: 137 mEq/L (ref 135–145)
Total Bilirubin: 0.6 mg/dL (ref 0.2–1.2)
Total Protein: 8.2 g/dL (ref 6.0–8.3)

## 2022-09-06 LAB — CBC WITH DIFFERENTIAL/PLATELET
Basophils Absolute: 0 10*3/uL (ref 0.0–0.1)
Basophils Relative: 0.6 % (ref 0.0–3.0)
Eosinophils Absolute: 0.2 10*3/uL (ref 0.0–0.7)
Eosinophils Relative: 2.3 % (ref 0.0–5.0)
HCT: 46.2 % (ref 39.0–52.0)
Hemoglobin: 14.8 g/dL (ref 13.0–17.0)
Lymphocytes Relative: 34.8 % (ref 12.0–46.0)
Lymphs Abs: 2.8 10*3/uL (ref 0.7–4.0)
MCHC: 32.1 g/dL (ref 30.0–36.0)
MCV: 83.9 fl (ref 78.0–100.0)
Monocytes Absolute: 0.6 10*3/uL (ref 0.1–1.0)
Monocytes Relative: 7.7 % (ref 3.0–12.0)
Neutro Abs: 4.4 10*3/uL (ref 1.4–7.7)
Neutrophils Relative %: 54.6 % (ref 43.0–77.0)
Platelets: 337 10*3/uL (ref 150.0–400.0)
RBC: 5.5 Mil/uL (ref 4.22–5.81)
RDW: 14.2 % (ref 11.5–14.6)
WBC: 8.1 10*3/uL (ref 4.5–10.5)

## 2022-09-06 MED ORDER — PREDNISONE 10 MG PO TABS: ORAL_TABLET | ORAL | 0 refills | Status: AC
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
## 2022-09-06 NOTE — Addendum Note (Signed)
Addended by: Arty Baumgartner A on: 09/06/2022 10:02 AM   Modules accepted: Orders

## 2022-09-07 LAB — URINE CYTOLOGY ANCILLARY ONLY
Chlamydia: NEGATIVE
Comment: NEGATIVE
Comment: NORMAL
Neisseria Gonorrhea: NEGATIVE

## 2022-09-07 LAB — HIV ANTIBODY (ROUTINE TESTING W REFLEX): HIV 1&2 Ab, 4th Generation: NONREACTIVE

## 2022-09-07 LAB — RPR: RPR Ser Ql: NONREACTIVE

## 2022-09-07 LAB — HEPATITIS C ANTIBODY: Hepatitis C Ab: NONREACTIVE

## 2023-01-14 ENCOUNTER — Encounter (HOSPITAL_COMMUNITY): Payer: Self-pay | Admitting: Emergency Medicine

## 2023-01-14 ENCOUNTER — Other Ambulatory Visit: Payer: Self-pay

## 2023-01-14 ENCOUNTER — Emergency Department (HOSPITAL_COMMUNITY)
Admission: EM | Admit: 2023-01-14 | Discharge: 2023-01-15 | Discharge status: Against Medical Advice | Payer: Medicaid Other | Attending: Emergency Medicine | Admitting: Emergency Medicine

## 2023-01-14 DIAGNOSIS — B974 Respiratory syncytial virus as the cause of diseases classified elsewhere: Secondary | ICD-10-CM | POA: Diagnosis not present

## 2023-01-14 DIAGNOSIS — Z5321 Procedure and treatment not carried out due to patient leaving prior to being seen by health care provider: Secondary | ICD-10-CM | POA: Insufficient documentation

## 2023-01-14 DIAGNOSIS — Z1152 Encounter for screening for COVID-19: Secondary | ICD-10-CM | POA: Diagnosis not present

## 2023-01-14 DIAGNOSIS — J029 Acute pharyngitis, unspecified: Secondary | ICD-10-CM | POA: Diagnosis present

## 2023-01-14 LAB — RESP PANEL BY RT-PCR (RSV, FLU A&B, COVID)  RVPGX2
Influenza A by PCR: NEGATIVE
Influenza B by PCR: NEGATIVE
Resp Syncytial Virus by PCR: POSITIVE — AB
SARS Coronavirus 2 by RT PCR: NEGATIVE

## 2023-01-14 NOTE — ED Triage Notes (Signed)
 Patient coming to ED for evaluation of sore throat, nasal drainage, cough, and generalized fatigue.  No reports of sick contacts.  No known fevers.

## 2023-01-14 NOTE — ED Notes (Signed)
 Urine sent to lab

## 2023-01-15 NOTE — ED Notes (Signed)
 Eloped from treatment area prior to room placement. 3 attempts.

## 2023-01-15 NOTE — ED Provider Notes (Signed)
 Left after triage   Nira Conn, MD 01/15/23 [phone]

## 2023-02-17 DIAGNOSIS — J069 Acute upper respiratory infection, unspecified: Secondary | ICD-10-CM | POA: Diagnosis not present

## 2023-02-17 DIAGNOSIS — R059 Cough, unspecified: Secondary | ICD-10-CM | POA: Diagnosis not present

## 2023-02-17 DIAGNOSIS — R0981 Nasal congestion: Secondary | ICD-10-CM | POA: Diagnosis not present

## 2023-03-01 ENCOUNTER — Other Ambulatory Visit: Payer: Self-pay

## 2023-03-01 ENCOUNTER — Encounter (HOSPITAL_BASED_OUTPATIENT_CLINIC_OR_DEPARTMENT_OTHER): Payer: Self-pay | Admitting: Emergency Medicine

## 2023-03-01 ENCOUNTER — Emergency Department (HOSPITAL_BASED_OUTPATIENT_CLINIC_OR_DEPARTMENT_OTHER)
Admission: EM | Admit: 2023-03-01 | Discharge: 2023-03-01 | Disposition: A | Discharge status: Home / Self Care | Payer: Medicaid Other | Attending: Emergency Medicine | Admitting: Emergency Medicine

## 2023-03-01 DIAGNOSIS — A64 Unspecified sexually transmitted disease: Secondary | ICD-10-CM

## 2023-03-01 DIAGNOSIS — A749 Chlamydial infection, unspecified: Secondary | ICD-10-CM | POA: Diagnosis not present

## 2023-03-01 DIAGNOSIS — R369 Urethral discharge, unspecified: Secondary | ICD-10-CM | POA: Diagnosis present

## 2023-03-01 LAB — URINALYSIS, ROUTINE W REFLEX MICROSCOPIC
Glucose, UA: NEGATIVE mg/dL
Ketones, ur: 40 mg/dL — AB
Leukocytes,Ua: NEGATIVE
Nitrite: NEGATIVE
Protein, ur: 100 mg/dL — AB
Specific Gravity, Urine: >=1.03 (ref 1.005–1.030)
pH: 6 (ref 5.0–8.0)

## 2023-03-01 LAB — URINALYSIS, MICROSCOPIC (REFLEX)

## 2023-03-01 MED ORDER — DOXYCYCLINE HYCLATE 100 MG PO CAPS: 100.0000 mg | ORAL_CAPSULE | Freq: Two times a day (BID) | ORAL | 0 refills | Status: AC

## 2023-03-01 MED ORDER — DOXYCYCLINE HYCLATE 100 MG PO TABS
100.0000 mg | ORAL_TABLET | Freq: Once | ORAL | Status: AC
  Administered 2023-03-01: 100 mg via ORAL
  Filled 2023-03-01: qty 1

## 2023-03-01 MED ORDER — CEFTRIAXONE SODIUM 500 MG IJ SOLR
500.0000 mg | Freq: Once | INTRAMUSCULAR | Status: AC
  Administered 2023-03-01: 500 mg via INTRAMUSCULAR
  Filled 2023-03-01: qty 500

## 2023-03-01 NOTE — ED Triage Notes (Signed)
 Patient c/o white penile discharge and "bumps" near the tip of the penis. Lesions noticed yesterday. Discharge x 1 week. Here for std testing. Denies known exposure.

## 2023-03-01 NOTE — ED Provider Notes (Signed)
 [facility] Provider Note   CSN: [id number] Arrival date & time: 03/01/23  0127     History  Chief Complaint  Patient presents with   SEXUALLY TRANSMITTED DISEASE    Garrett Zhang is a 23 y.o. male.  The history is provided by the patient.  Penile Discharge This is a new problem. The current episode started more than 1 week ago. The problem occurs constantly. The problem has not changed since onset.Pertinent negatives include no chest pain, no abdominal pain, no headaches and no shortness of breath. Nothing aggravates the symptoms. Nothing relieves the symptoms. He has tried nothing for the symptoms. The treatment provided no relief.  Also believes he has penile lesion but denies unprotected sex.      Home Medications Prior to Admission medications   Medication Sig Start Date End Date Taking? Authorizing Provider  acetaminophen (TYLENOL) 500 MG tablet Take 500 mg by mouth every 6 (six) hours as needed.    [provider]  albuterol (VENTOLIN HFA) 108 (90 Base) MCG/ACT inhaler Inhale 2 puffs into the lungs every 4 (four) hours as needed for wheezing. Patient may resume home supply. 08/29/22   McGowen, Maryjean Morn, MD  cetirizine (ZYRTEC ALLERGY) 10 MG tablet Take 1 tablet (10 mg total) by mouth daily. 07/01/22   Wallis Bamberg, PA-C  FLUoxetine (PROZAC) 20 MG capsule Take by mouth. 03/01/19   [provider]  fluticasone (FLOVENT HFA) 110 MCG/ACT inhaler Inhale 1 puff into the lungs 2 (two) times daily. 08/29/22   McGowen, Maryjean Morn, MD  ibuprofen (ADVIL) 600 MG tablet Take 1 tablet (600 mg total) by mouth every 6 (six) hours as needed. 07/01/22   Wallis Bamberg, PA-C  meloxicam (MOBIC) 15 MG tablet Take 1 tablet (15 mg total) by mouth daily. Patient not taking: Reported on 08/29/2022 03/11/22   Jeoffrey Massed, MD  predniSONE (DELTASONE) 10 MG tablet 3 tabs po every day x 3d then 2 tabs po every day x 3d then 1 tab po every day x  3d 09/06/22   McGowen, Maryjean Morn, MD  promethazine-dextromethorphan (PROMETHAZINE-DM) 6.25-15 MG/5ML syrup Take 5 mLs by mouth 3 (three) times daily as needed for cough. Patient not taking: Reported on 08/29/2022 07/01/22   Wallis Bamberg, PA-C  pseudoephedrine (SUDAFED) 30 MG tablet Take 1 tablet (30 mg total) by mouth every 8 (eight) hours as needed for congestion. 07/01/22   Wallis Bamberg, PA-C      Allergies    Bee pollen, Chocolate flavoring agent (non-screening), Flavoring agent, Pollen extract, Cocoa, Other, Tramadol hcl, Chocolate, Hydrocodone, Other, and Tramadol    Review of Systems   Review of Systems  Respiratory:  Negative for shortness of breath.   Cardiovascular:  Negative for chest pain.  Gastrointestinal:  Negative for abdominal pain.  Genitourinary:  Positive for penile discharge.  Neurological:  Negative for headaches.    Physical Exam Updated Vital Signs BP (!) 128/94 (BP Location: Left Arm)   Pulse 75   Temp 98.3 F (36.8 C) (Oral)   Resp 17   Ht 6\' 2"  (1.88 m)   Wt 72.1 kg   SpO2 99%   BMI 20.41 kg/m  Physical Exam Vitals and nursing note reviewed. Exam conducted with a chaperone present.  Constitutional:      General: He is not in acute distress.    Appearance: Normal appearance. He is well-developed. He is not diaphoretic.  HENT:     Head: Normocephalic and atraumatic.  Nose: Nose normal.  Eyes:     Conjunctiva/sclera: Conjunctivae normal.     Pupils: Pupils are equal, round, and reactive to light.  Cardiovascular:     Rate and Rhythm: Normal rate and regular rhythm.     Pulses: Normal pulses.     Heart sounds: Normal heart sounds.  Pulmonary:     Effort: Pulmonary effort is normal.     Breath sounds: Normal breath sounds. No wheezing or rales.  Abdominal:     General: Bowel sounds are normal.     Palpations: Abdomen is soft.     Tenderness: There is no abdominal tenderness. There is no guarding or rebound.  Genitourinary:    Comments: Small  abrasion on glans Musculoskeletal:        General: Normal range of motion.     Cervical back: Normal range of motion and neck supple.  Skin:    General: Skin is warm and dry.  Neurological:     Mental Status: He is alert and oriented to person, place, and time.     ED Results / Procedures / Treatments   Labs (all labs ordered are listed, but only abnormal results are displayed) Results for orders placed or performed during the hospital encounter of 03/01/23  Urinalysis, Routine w reflex microscopic -Urine, Clean Catch   Collection Time: 03/01/23  1:46 AM  Result Value Ref Range   Color, Urine YELLOW YELLOW   APPearance CLEAR CLEAR   Specific Gravity, Urine >=1.030 1.005 - 1.030   pH 6.0 5.0 - 8.0   Glucose, UA NEGATIVE NEGATIVE mg/dL   Hgb urine dipstick TRACE (A) NEGATIVE   Bilirubin Urine SMALL (A) NEGATIVE   Ketones, ur 40 (A) NEGATIVE mg/dL   Protein, ur 846 (A) NEGATIVE mg/dL   Nitrite NEGATIVE NEGATIVE   Leukocytes,Ua NEGATIVE NEGATIVE  Urinalysis, Microscopic (reflex)   Collection Time: 03/01/23  1:46 AM  Result Value Ref Range   RBC / HPF 21-50 0 - 5 RBC/hpf   WBC, UA 6-10 0 - 5 WBC/hpf   Bacteria, UA RARE (A) NONE SEEN   Squamous Epithelial / HPF 0-5 0 - 5 /HPF   No results found.   Radiology No results found.  Procedures Procedures    Medications Ordered in ED Medications  cefTRIAXone (ROCEPHIN) injection 500 mg (has no administration in time range)  doxycycline (VIBRA-TABS) tablet 100 mg (has no administration in time range)    ED Course/ Medical Decision Making/ A&P                                 Medical Decision Making Penile discharge x 1 week   Amount and/or Complexity of Data Reviewed External Data Reviewed: notes.    Details: Previous notes reviewed  Labs: ordered.    Details: Urine with proteinuria GC chlamydia sent   Risk Prescription drug management. Risk Details: Will treat for gonorrhea and chlamydia.  No penile lesions.  Have  informed patient no sex until 7 days after all partners treated.  He and they can follow up for treatment and routine checks with the [facility] health department.  Stable for discharge.      Final Clinical Impression(s) / ED Diagnoses Final diagnoses:  STI (sexually transmitted infection)   I have reviewed the triage vital signs and the nursing notes. Pertinent labs & imaging results that were available during my care of the patient were reviewed by me and considered in  my medical decision making (see chart for details). After history, exam, and medical workup I feel the patient has been appropriately medically screened and is safe for discharge home. Pertinent diagnoses were discussed with the patient. Patient was given return precautions.  Rx / DC Orders ED Discharge Orders     None         Gavriel Holzhauer, MD 03/01/23 [phone]

## 2023-03-01 NOTE — Discharge Instructions (Addendum)
 No sexual activity until 7 days after all partners treated.  Use condoms, follow up with county health department

## 2023-03-02 LAB — GC/CHLAMYDIA PROBE AMP (~~LOC~~) NOT AT ARMC
Chlamydia: POSITIVE — AB
Comment: NEGATIVE
Comment: NORMAL
Neisseria Gonorrhea: NEGATIVE

## 2023-04-07 ENCOUNTER — Other Ambulatory Visit: Payer: Self-pay

## 2023-04-07 ENCOUNTER — Encounter: Payer: Self-pay | Admitting: Family Medicine

## 2023-04-07 MED ORDER — LEVOCETIRIZINE DIHYDROCHLORIDE 5 MG PO TABS
5.0000 mg | ORAL_TABLET | Freq: Every evening | ORAL | 2 refills | Status: AC
Start: 2023-04-07 — End: ?

## 2023-04-24 DIAGNOSIS — Z114 Encounter for screening for human immunodeficiency virus [HIV]: Secondary | ICD-10-CM | POA: Diagnosis not present

## 2023-04-24 DIAGNOSIS — Z113 Encounter for screening for infections with a predominantly sexual mode of transmission: Secondary | ICD-10-CM | POA: Diagnosis not present

## 2023-06-26 ENCOUNTER — Telehealth: Payer: Self-pay

## 2023-06-26 NOTE — Transitions of Care (Post Inpatient/ED Visit) (Unsigned)
   06/26/2023  Name: Garrett Zhang MRN: [id number] DOB: 04-07-2000  Today's TOC FU Call Status: Today's TOC FU Call Status:: Unsuccessful Call (1st Attempt) Unsuccessful Call (1st Attempt) Date: 06/26/23  Attempted to reach the patient regarding the most recent Inpatient/ED visit.  Follow Up Plan: Additional outreach attempts will be made to reach the patient to complete the Transitions of Care (Post Inpatient/ED visit) call.   Signature Germain Kohler, CMA (AAMA)  [facility] [phone]
